# Patient Record
Sex: Female | Born: 2000 | Hispanic: No | Marital: Single | Smoking: Never smoker
Health system: Southern US, Community
[De-identification: ages and names within clinical notes are randomized; demographics above are authoritative.]

---
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
## 2021-07-15 ENCOUNTER — Emergency Department (HOSPITAL_COMMUNITY): Payer: PRIVATE HEALTH INSURANCE

## 2021-07-15 ENCOUNTER — Emergency Department (HOSPITAL_COMMUNITY)
Admission: EM | Admit: 2021-07-15 | Discharge: 2021-07-15 | Disposition: A | Discharge status: Home / Self Care | Payer: PRIVATE HEALTH INSURANCE | Attending: Emergency Medicine | Admitting: Emergency Medicine

## 2021-07-15 ENCOUNTER — Encounter (HOSPITAL_COMMUNITY): Payer: Self-pay | Admitting: Emergency Medicine

## 2021-07-15 ENCOUNTER — Other Ambulatory Visit: Payer: Self-pay

## 2021-07-15 DIAGNOSIS — U071 COVID-19: Secondary | ICD-10-CM | POA: Diagnosis not present

## 2021-07-15 DIAGNOSIS — J45909 Unspecified asthma, uncomplicated: Secondary | ICD-10-CM | POA: Diagnosis not present

## 2021-07-15 DIAGNOSIS — R000 Tachycardia, unspecified: Secondary | ICD-10-CM | POA: Diagnosis not present

## 2021-07-15 DIAGNOSIS — R0602 Shortness of breath: Secondary | ICD-10-CM | POA: Diagnosis present

## 2021-07-15 DIAGNOSIS — Z91010 Allergy to peanuts: Secondary | ICD-10-CM | POA: Diagnosis not present

## 2021-07-15 LAB — I-STAT BETA HCG BLOOD, ED (MC, WL, AP ONLY): I-stat hCG, quantitative: <5 m[IU]/mL (ref <5)

## 2021-07-15 LAB — GROUP A STREP BY PCR: Group A Strep by PCR: NOT DETECTED

## 2021-07-15 LAB — RESP PANEL BY RT-PCR (FLU A&B, COVID) ARPGX2
Influenza A by PCR: NEGATIVE
Influenza B by PCR: NEGATIVE
SARS Coronavirus 2 by RT PCR: POSITIVE — AB

## 2021-07-15 MED ORDER — PREDNISONE 20 MG PO TABS: ORAL_TABLET | ORAL | 0 refills | Status: AC

## 2021-07-15 MED ORDER — ALBUTEROL SULFATE HFA 108 (90 BASE) MCG/ACT IN AERS
2.0000 | INHALATION_SPRAY | RESPIRATORY_TRACT | Status: DC | PRN
  Administered 2021-07-15: 2 via RESPIRATORY_TRACT
  Filled 2021-07-15: qty 6.7

## 2021-07-15 MED ORDER — PREDNISONE 20 MG PO TABS
60.0000 mg | ORAL_TABLET | Freq: Once | ORAL | Status: AC
  Administered 2021-07-15: 60 mg via ORAL
  Filled 2021-07-15: qty 3

## 2021-07-15 MED ORDER — IBUPROFEN 200 MG PO TABS
600.0000 mg | ORAL_TABLET | Freq: Once | ORAL | Status: AC
  Administered 2021-07-15: 600 mg via ORAL
  Filled 2021-07-15: qty 1

## 2021-07-15 NOTE — ED Triage Notes (Signed)
Pt here for shortness of breath since yesterday w/ sore throat. Pt has hx of asthma and is out of her albuterol inhaler. Pt state she is allergic to cats and has been around one, she believes that her sore throat and wheezing is due to this. Pt reports some chest tightness w/ deep breathing.

## 2021-07-15 NOTE — ED Provider Notes (Signed)
[facility] EMERGENCY DEPARTMENT Provider Note   CSN: [id number] Arrival date & time: 07/15/21  0030     History  Chief Complaint  Patient presents with   Shortness of Breath   Sore Throat    Brianna Hale is a 21 y.o. female.  The history is provided by the patient.  Shortness of Breath Severity:  Moderate Onset quality:  Gradual Duration:  1 day Timing:  Constant Progression:  Waxing and waning Chronicity:  Recurrent Context: animal exposure and URI   Relieved by:  Nothing Worsened by:  Nothing Ineffective treatments:  None tried Associated symptoms: sore throat and wheezing   Associated symptoms: no abdominal pain, no chest pain, no fever, no headaches and no neck pain   Associated symptoms comment:  Congestion Risk factors: no hx of PE/DVT, no oral contraceptive use, no prolonged immobilization and no recent surgery   Sore Throat This is a new problem. The current episode started 2 days ago. The problem occurs constantly. The problem has not changed since onset.Associated symptoms include shortness of breath. Pertinent negatives include no chest pain, no abdominal pain and no headaches. Nothing aggravates the symptoms. Nothing relieves the symptoms. She has tried nothing for the symptoms.      Home Medications Prior to Admission medications   Medication Sig Start Date End Date Taking? Authorizing Provider  albuterol (PROVENTIL HFA;VENTOLIN HFA) 108 (90 BASE) MCG/ACT inhaler Inhale 1-2 puffs into the lungs every 6 (six) hours as needed for wheezing. 09/29/12   Moreno-Coll, Adlih, MD  cetirizine (ZYRTEC) 10 MG tablet Take 1 tablet (10 mg total) by mouth daily. 09/29/12   Moreno-Coll, Adlih, MD  prednisoLONE (ORAPRED) 15 MG/5ML solution 13 mls by mouth daily for 5 days 09/29/12   Moreno-Coll, Adlih, MD      Allergies    Peanuts [peanut oil] and Penicillins    Review of Systems   Review of Systems  Constitutional:  Negative for fever.  HENT:   Positive for sore throat.   Eyes:  Negative for redness.  Respiratory:  Positive for shortness of breath and wheezing.   Cardiovascular:  Negative for chest pain.  Gastrointestinal:  Negative for abdominal pain.  Genitourinary:  Negative for difficulty urinating.  Musculoskeletal:  Negative for neck pain.  Neurological:  Negative for headaches.  Psychiatric/Behavioral:  Negative for agitation.   All other systems reviewed and are negative.  Physical Exam Updated Vital Signs BP 117/90    Pulse (!) 106    Temp 98.8 F (37.1 C) (Oral)    Resp (!) 23    LMP 06/12/2021 (Approximate)    SpO2 100%  Physical Exam Vitals and nursing note reviewed.  Constitutional:      General: She is not in acute distress.    Appearance: Normal appearance.  HENT:     Head: Normocephalic and atraumatic.     Nose: Congestion present.  Eyes:     Conjunctiva/sclera: Conjunctivae normal.     Pupils: Pupils are equal, round, and reactive to light.  Cardiovascular:     Rate and Rhythm: Normal rate and regular rhythm.     Pulses: Normal pulses.     Heart sounds: Normal heart sounds.  Pulmonary:     Effort: Pulmonary effort is normal.     Breath sounds: Normal breath sounds.  Abdominal:     General: Abdomen is flat. Bowel sounds are normal.     Palpations: Abdomen is soft.     Tenderness: There is no abdominal tenderness.  There is no guarding.  Musculoskeletal:        General: Normal range of motion.     Cervical back: Normal range of motion and neck supple.  Skin:    General: Skin is warm and dry.     Capillary Refill: Capillary refill takes less than 2 seconds.  Neurological:     General: No focal deficit present.     Mental Status: She is alert and oriented to person, place, and time.     Deep Tendon Reflexes: Reflexes normal.  Psychiatric:        Mood and Affect: Mood normal.        Behavior: Behavior normal.    ED Results / Procedures / Treatments   Labs (all labs ordered are listed, but  only abnormal results are displayed) Labs Reviewed  RESP PANEL BY RT-PCR (FLU A&B, COVID) ARPGX2 - Abnormal; Notable for the following components:      Result Value   SARS Coronavirus 2 by RT PCR POSITIVE (*)    All other components within normal limits  GROUP A STREP BY PCR  I-STAT BETA HCG BLOOD, ED ([facility], [facility], [facility] ONLY)    EKG None  Radiology DG Chest 2 View  Result Date: 07/15/2021 CLINICAL DATA:  Shortness of breath EXAM: CHEST - 2 VIEW COMPARISON:  04/09/2012 FINDINGS: The heart size and mediastinal contours are within normal limits. Both lungs are clear. The visualized skeletal structures are unremarkable. IMPRESSION: Normal study. Electronically Signed   By: Rolm Baptise M.D.   On: 07/15/2021 01:42    Procedures Procedures    Medications Ordered in ED Medications  albuterol (VENTOLIN HFA) 108 (90 Base) MCG/ACT inhaler 2 puff (2 puffs Inhalation Given 07/15/21 0126)  ibuprofen (ADVIL) tablet 600 mg (600 mg Oral Given 07/15/21 0130)  predniSONE (DELTASONE) tablet 60 mg (60 mg Oral Given 07/15/21 0249)    ED Course/ Medical Decision Making/ A&P                           Medical Decision Making Amount and/or Complexity of Data Reviewed Radiology: ordered.  Risk Prescription drug management.  This patient presents to the ED for concern of  sore throat and wheezing this involves an extensive number of treatment options, and is a complaint that carries with it a high risk of complications and morbidity.  The differential diagnosis includes asthma and covid    Co morbidities that complicate the patient evaluation Asthma     Lab Tests:  I Ordered, and personally interpreted labs.  The pertinent results include:  covid and pregnancy test covid is positive     Cardiac Monitoring:  The patient was maintained on a cardiac monitor.  I personally viewed and interpreted the cardiac monitored which showed an underlying rhythm of: sinus tachycardia post neb treatment    Medicines  ordered and prescription drug management:  I ordered medication including claritin and steroids  Reevaluation of the patient after these medicines showed that the patient improved I have reviewed the patients home medicines and have made adjustments as needed      Critical Interventions:  Covid swab and breathing treatment     Problem List / ED Course:  Covid and asthma    Reevaluation:  After the interventions noted above, I reevaluated the patient and found that they have :improved   Social Determinants of Health:  None    Dispostion:  After consideration of the diagnostic results and the  patients response to treatment, I feel that the patent would benefit from close follow up with PMD as an outpatient    Final Clinical Impression(s) / ED Diagnoses Final diagnoses:  None   Return for intractable cough, coughing up blood, fevers > 100.4 unrelieved by medication, shortness of breath, intractable vomiting, chest pain, shortness of breath, weakness, numbness, changes in speech, facial asymmetry, abdominal pain, passing out, Inability to tolerate liquids or food, cough, altered mental status or any concerns. No signs of systemic illness or infection. The patient is nontoxic-appearing on exam and vital signs are within normal limits.  I have reviewed the triage vital signs and the nursing notes. Pertinent labs & imaging results that were available during my care of the patient were reviewed by me and considered in my medical decision making (see chart for details). After history, exam, and medical workup I feel the patient has been appropriately medically screened and is safe for discharge home. Pertinent diagnoses were discussed with the patient. Patient was given return precautions. Rx / DC Orders ED Discharge Orders     None         Farley Crooker, MD 07/15/21 [id number]

## 2023-09-10 DIAGNOSIS — J4541 Moderate persistent asthma with (acute) exacerbation: Secondary | ICD-10-CM | POA: Diagnosis not present

## 2023-09-10 DIAGNOSIS — N946 Dysmenorrhea, unspecified: Secondary | ICD-10-CM | POA: Diagnosis not present

## 2023-09-10 DIAGNOSIS — F413 Other mixed anxiety disorders: Secondary | ICD-10-CM | POA: Diagnosis not present

## 2023-09-10 DIAGNOSIS — D508 Other iron deficiency anemias: Secondary | ICD-10-CM | POA: Diagnosis not present

## 2023-12-04 ENCOUNTER — Encounter: Payer: PRIVATE HEALTH INSURANCE | Admitting: Obstetrics and Gynecology
